# Patient Record
Sex: Male | Born: 1970 | Race: Black or African American | Hispanic: No | Marital: Single | Smoking: Current every day smoker
Health system: Southern US, Community
[De-identification: ages and names within clinical notes are randomized; demographics above are authoritative.]

---

## 2011-11-25 ENCOUNTER — Emergency Department: Payer: Self-pay | Admitting: Emergency Medicine

## 2012-06-06 ENCOUNTER — Emergency Department: Payer: Self-pay | Admitting: Emergency Medicine

## 2017-10-10 ENCOUNTER — Emergency Department: Payer: Self-pay

## 2017-10-10 ENCOUNTER — Other Ambulatory Visit: Payer: Self-pay

## 2017-10-10 ENCOUNTER — Emergency Department
Admission: EM | Admit: 2017-10-10 | Discharge: 2017-10-10 | Disposition: A | Discharge status: Home / Self Care | Payer: Self-pay | Attending: Emergency Medicine | Admitting: Emergency Medicine

## 2017-10-10 ENCOUNTER — Encounter: Payer: Self-pay | Admitting: Emergency Medicine

## 2017-10-10 DIAGNOSIS — F17200 Nicotine dependence, unspecified, uncomplicated: Secondary | ICD-10-CM | POA: Insufficient documentation

## 2017-10-10 DIAGNOSIS — M79672 Pain in left foot: Secondary | ICD-10-CM | POA: Insufficient documentation

## 2017-10-10 MED ORDER — IBUPROFEN 600 MG PO TABS
ORAL_TABLET | ORAL | Status: AC
  Filled 2017-10-10: qty 1

## 2017-10-10 MED ORDER — IBUPROFEN 600 MG PO TABS
600.0000 mg | ORAL_TABLET | Freq: Once | ORAL | Status: AC
  Administered 2017-10-10: 600 mg via ORAL

## 2017-10-10 MED ORDER — IBUPROFEN 600 MG PO TABS: 600.0000 mg | ORAL_TABLET | Freq: Four times a day (QID) | ORAL | 0 refills | Status: DC | PRN
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
## 2017-10-10 NOTE — ED Triage Notes (Signed)
Pt to ED c/o left foot pain and swelling. Pt denies recently injury. Pt states that he is having trouble putting weight on his foot. Pt in NAD at this time.

## 2018-01-20 ENCOUNTER — Emergency Department (HOSPITAL_COMMUNITY): Payer: No Typology Code available for payment source

## 2018-01-20 ENCOUNTER — Emergency Department (HOSPITAL_COMMUNITY)
Admission: EM | Admit: 2018-01-20 | Discharge: 2018-01-20 | Disposition: A | Discharge status: Home / Self Care | Payer: No Typology Code available for payment source | Attending: Emergency Medicine | Admitting: Emergency Medicine

## 2018-01-20 ENCOUNTER — Other Ambulatory Visit: Payer: Self-pay

## 2018-01-20 ENCOUNTER — Encounter (HOSPITAL_COMMUNITY): Payer: Self-pay

## 2018-01-20 DIAGNOSIS — F17200 Nicotine dependence, unspecified, uncomplicated: Secondary | ICD-10-CM | POA: Diagnosis not present

## 2018-01-20 DIAGNOSIS — R0781 Pleurodynia: Secondary | ICD-10-CM

## 2018-01-20 DIAGNOSIS — R0789 Other chest pain: Secondary | ICD-10-CM | POA: Diagnosis not present

## 2018-01-20 DIAGNOSIS — M25512 Pain in left shoulder: Secondary | ICD-10-CM | POA: Diagnosis present

## 2018-01-20 MED ORDER — TRAMADOL HCL 50 MG PO TABS: 50.0000 mg | ORAL_TABLET | Freq: Four times a day (QID) | ORAL | 0 refills | Status: DC | PRN
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
## 2018-01-20 NOTE — ED Notes (Signed)
Pt stable, ambulatory, and verbalizes understanding of d/c instructions.  

## 2018-01-20 NOTE — Discharge Instructions (Signed)
Please read attached information. If you experience any new or worsening signs or symptoms please return to the emergency room for evaluation. Please follow-up with your primary care provider or specialist as discussed. Please use medication prescribed only as directed and discontinue taking if you have any concerning signs or symptoms.   °

## 2018-07-27 ENCOUNTER — Encounter (HOSPITAL_BASED_OUTPATIENT_CLINIC_OR_DEPARTMENT_OTHER): Payer: Self-pay | Admitting: Emergency Medicine

## 2018-07-27 ENCOUNTER — Emergency Department (HOSPITAL_BASED_OUTPATIENT_CLINIC_OR_DEPARTMENT_OTHER)
Admission: EM | Admit: 2018-07-27 | Discharge: 2018-07-27 | Disposition: A | Discharge status: Home / Self Care | Payer: Self-pay | Attending: Emergency Medicine | Admitting: Emergency Medicine

## 2018-07-27 ENCOUNTER — Other Ambulatory Visit: Payer: Self-pay

## 2018-07-27 ENCOUNTER — Emergency Department (HOSPITAL_BASED_OUTPATIENT_CLINIC_OR_DEPARTMENT_OTHER): Payer: Self-pay

## 2018-07-27 DIAGNOSIS — R197 Diarrhea, unspecified: Secondary | ICD-10-CM | POA: Insufficient documentation

## 2018-07-27 DIAGNOSIS — F17200 Nicotine dependence, unspecified, uncomplicated: Secondary | ICD-10-CM | POA: Insufficient documentation

## 2018-07-27 DIAGNOSIS — R112 Nausea with vomiting, unspecified: Secondary | ICD-10-CM | POA: Insufficient documentation

## 2018-07-27 DIAGNOSIS — A599 Trichomoniasis, unspecified: Secondary | ICD-10-CM | POA: Insufficient documentation

## 2018-07-27 LAB — COMPREHENSIVE METABOLIC PANEL
ALBUMIN: 3.9 g/dL (ref 3.5–5.0)
ALK PHOS: 63 U/L (ref 38–126)
ALT: 12 U/L (ref 0–44)
ANION GAP: 9 (ref 5–15)
AST: 24 U/L (ref 15–41)
BUN: 13 mg/dL (ref 6–20)
CO2: 25 mmol/L (ref 22–32)
Calcium: 8.9 mg/dL (ref 8.9–10.3)
Chloride: 102 mmol/L (ref 98–111)
Creatinine, Ser: 1.16 mg/dL (ref 0.61–1.24)
GFR calc Af Amer: >60 mL/min (ref >60)
GFR calc non Af Amer: >60 mL/min (ref >60)
GLUCOSE: 111 mg/dL — AB (ref 70–99)
POTASSIUM: 4 mmol/L (ref 3.5–5.1)
SODIUM: 136 mmol/L (ref 135–145)
Total Bilirubin: 0.8 mg/dL (ref 0.3–1.2)
Total Protein: 7.8 g/dL (ref 6.5–8.1)

## 2018-07-27 LAB — LIPASE, BLOOD: Lipase: 28 U/L (ref 11–51)

## 2018-07-27 LAB — URINALYSIS, MICROSCOPIC (REFLEX)

## 2018-07-27 LAB — CBC WITH DIFFERENTIAL/PLATELET
BASOS PCT: 0 %
Basophils Absolute: 0 10*3/uL (ref 0.0–0.1)
Eosinophils Absolute: 0.1 10*3/uL (ref 0.0–0.7)
Eosinophils Relative: 1 %
HCT: 36.7 % — ABNORMAL LOW (ref 39.0–52.0)
HEMOGLOBIN: 12.5 g/dL — AB (ref 13.0–17.0)
Lymphocytes Relative: 29 %
Lymphs Abs: 2.2 10*3/uL (ref 0.7–4.0)
MCH: 30.4 pg (ref 26.0–34.0)
MCHC: 34.1 g/dL (ref 30.0–36.0)
MCV: 89.3 fL (ref 78.0–100.0)
MONOS PCT: 10 %
Monocytes Absolute: 0.8 10*3/uL (ref 0.1–1.0)
NEUTROS ABS: 4.6 10*3/uL (ref 1.7–7.7)
NEUTROS PCT: 60 %
Platelets: 170 10*3/uL (ref 150–400)
RBC: 4.11 MIL/uL — ABNORMAL LOW (ref 4.22–5.81)
RDW: 12 % (ref 11.5–15.5)
WBC: 7.5 10*3/uL (ref 4.0–10.5)

## 2018-07-27 LAB — URINALYSIS, ROUTINE W REFLEX MICROSCOPIC
Bilirubin Urine: NEGATIVE
Glucose, UA: NEGATIVE mg/dL
Hgb urine dipstick: NEGATIVE
Ketones, ur: NEGATIVE mg/dL
NITRITE: NEGATIVE
PH: 6 (ref 5.0–8.0)
Protein, ur: NEGATIVE mg/dL

## 2018-07-27 MED ORDER — AZITHROMYCIN 250 MG PO TABS
1000.0000 mg | ORAL_TABLET | Freq: Once | ORAL | Status: AC
  Administered 2018-07-27: 1000 mg via ORAL
  Filled 2018-07-27: qty 4

## 2018-07-27 MED ORDER — METRONIDAZOLE 500 MG PO TABS
2000.0000 mg | ORAL_TABLET | Freq: Once | ORAL | Status: AC
  Administered 2018-07-27: 2000 mg via ORAL
  Filled 2018-07-27: qty 4

## 2018-07-27 MED ORDER — ONDANSETRON HCL 4 MG/2ML IJ SOLN
4.0000 mg | Freq: Once | INTRAMUSCULAR | Status: AC
Start: 2018-07-27 — End: 2018-07-27
  Administered 2018-07-27: 4 mg via INTRAVENOUS
  Filled 2018-07-27: qty 2

## 2018-07-27 MED ORDER — LOPERAMIDE HCL 2 MG PO CAPS
2.0000 mg | ORAL_CAPSULE | Freq: Once | ORAL | Status: AC
  Administered 2018-07-27: 2 mg via ORAL
  Filled 2018-07-27: qty 1

## 2018-07-27 MED ORDER — CEFTRIAXONE SODIUM 250 MG IJ SOLR
250.0000 mg | Freq: Once | INTRAMUSCULAR | Status: AC
  Administered 2018-07-27: 250 mg via INTRAMUSCULAR
  Filled 2018-07-27: qty 250

## 2018-07-27 MED ORDER — KETOROLAC TROMETHAMINE 30 MG/ML IJ SOLN
30.0000 mg | Freq: Once | INTRAMUSCULAR | Status: AC
  Administered 2018-07-27: 30 mg via INTRAVENOUS
  Filled 2018-07-27: qty 1

## 2018-07-27 MED ORDER — PROMETHAZINE HCL 25 MG PO TABS: 25.0000 mg | ORAL_TABLET | Freq: Four times a day (QID) | ORAL | 0 refills | Status: AC | PRN

## 2018-07-27 MED ORDER — SODIUM CHLORIDE 0.9 % IV BOLUS (SEPSIS)
1000.0000 mL | Freq: Once | INTRAVENOUS | Status: AC
  Administered 2018-07-27: 1000 mL via INTRAVENOUS

## 2018-07-27 NOTE — ED Provider Notes (Signed)
TIME SEEN: 3:16 AM  CHIEF COMPLAINT: Flulike symptoms  HPI: Patient is a 47 year old male with no significant past medical history who presents to the emergency department with flulike symptoms.  Reports diffuse headache, body aches, productive cough, vomiting, diarrhea.  No abdominal pain.  No sick contacts or recent travel.  No dysuria, hematuria, discharge.  No rash or tick bites.  No known fever.  ROS: See HPI Constitutional: no fever  Eyes: no drainage  ENT: no runny nose   Cardiovascular:  no chest pain  Resp: no SOB  GI: Diarrhea and vomiting GU: no dysuria Integumentary: no rash  Allergy: no hives  Musculoskeletal: no leg swelling  Neurological: no slurred speech ROS otherwise negative  PAST MEDICAL HISTORY/PAST SURGICAL HISTORY:  History reviewed. No pertinent past medical history.  MEDICATIONS:  Prior to Admission medications   Not on File    ALLERGIES:  No Known Allergies  SOCIAL HISTORY:  Social History   Tobacco Use  . Smoking status: Current Every Day Smoker  . Smokeless tobacco: Never Used  Substance Use Topics  . Alcohol use: No    Frequency: Never    FAMILY HISTORY: No family history on file.  EXAM: BP 131/85   Pulse 91   Temp 98.9 F (37.2 C) (Oral)   Resp 18   Ht 5\' 8"  (1.727 m)   Wt 81.6 kg   SpO2 100%   BMI 27.37 kg/m  CONSTITUTIONAL: Alert and oriented and responds appropriately to questions. Well-appearing; well-nourished HEAD: Normocephalic EYES: Conjunctivae clear, pupils appear equal, EOMI ENT: normal nose; moist mucous membranes; No pharyngeal erythema or petechiae, no tonsillar hypertrophy or exudate, no uvular deviation, no unilateral swelling, no trismus or drooling, no muffled voice, normal phonation, no stridor, no dental caries present, no drainable dental abscess noted, no Ludwig's angina, tongue sits flat in the bottom of the mouth, no angioedema, no facial erythema or warmth, no facial swelling; no pain with movement of  the neck. NECK: Supple, no meningismus, no nuchal rigidity, no LAD  CARD: RRR; S1 and S2 appreciated; no murmurs, no clicks, no rubs, no gallops RESP: Normal chest excursion without splinting or tachypnea; breath sounds bilaterally without wheezing or rhonchi, some crackles noted at the left lung base, no hypoxia or respiratory distress, speaking full sentences ABD/GI: Normal bowel sounds; non-distended; soft, non-tender, no rebound, no guarding, no peritoneal signs, no hepatosplenomegaly BACK:  The back appears normal and is non-tender to palpation, there is no CVA tenderness EXT: Normal ROM in all joints; non-tender to palpation; no edema; normal capillary refill; no cyanosis, no calf tenderness or swelling    SKIN: Normal color for age and race; warm; no rash NEURO: Moves all extremities equally PSYCH: The patient's mood and manner are appropriate. Grooming and personal hygiene are appropriate.  MEDICAL DECISION MAKING: Patient here with flulike symptoms.  He does have some asymmetric breath sounds.  Will obtain chest x-ray to evaluate for possible pneumonia.  Reports 3 to 4 days of vomiting and diarrhea.  Will check labs to ensure no electrolyte derangement.  Will check kidney function, LFTs and lipase.  Abdominal exam today is benign.  Doubt appendicitis, colitis, cholecystitis, bowel obstruction.  Suspect viral illness causing symptoms.  We will treat symptomatically with IV fluids, Toradol, Zofran, Imodium.  ED PROGRESS: Patient's labs are unremarkable.  Chest x-ray shows no pneumonia.  He reports feeling better.  Drinking without difficulty or vomiting.    Patient's urine shows that he is positive for trichomonas.  We  will treat with 2 g of Flagyl.  We will also empirically cover for gonorrhea and chlamydia today.  He declines testing for gonorrhea and chlamydia.  Will give outpatient follow-up for the STD clinic for HIV and syphilis testing.  Have recommended he avoid sexual intercourse for  the next week.  We will also provide his partner with 2 g of Flagyl as she is at the bedside.   At this time, I do not feel there is any life-threatening condition present. I have reviewed and discussed all results (EKG, imaging, lab, urine as appropriate) and exam findings with patient/family. I have reviewed nursing notes and appropriate previous records.  I feel the patient is safe to be discharged home without further emergent workup and can continue workup as an outpatient as needed. Discussed usual and customary return precautions. Patient/family verbalize understanding and are comfortable with this plan.  Outpatient follow-up has been provided if needed. All questions have been answered.     Arieana Somoza, Layla Maw, DO 07/27/18 [phone]

## 2018-07-27 NOTE — ED Triage Notes (Addendum)
Pt reports vomiting intermittently x 3-4 days. Pt also c/o cough, headache and diarrhea.

## 2018-07-27 NOTE — Discharge Instructions (Addendum)
You may alternate Tylenol 1000 mg every 6 hours as needed for pain and Ibuprofen 800 mg every 8 hours as needed for pain.  Please take Ibuprofen with food.  You may use over-the-counter Imodium as needed for diarrhea.   You have been treated today for gonorrhea, chlamydia and trichomonas.  I recommend close follow-up with the STD clinic for HIV and syphilis testing.  Please avoid sexual intercourse for the next week.   To find a primary care or specialty doctor please call [phone] or [phone] to access "[facility] Find a Doctor Service."  You may also go on the [facility] website at [email or web address]  There are also multiple [facility], [facility], [facility] and [facility] practices throughout the [place] that are frequently accepting new patients. You may find a clinic that is close to your home and contact them.  [facility] -  [address] [place] [phone]   [facility] -  [address] [place] [phone]   [facility] [phone]  [place] [phone]

## 2018-07-27 NOTE — ED Notes (Signed)
ED Provider at bedside. 

## 2019-05-10 IMAGING — DX DG CHEST 2V
2 series · 2 of 2 positions shown · non-contrast
Comparison: 01/20/2018

CLINICAL DATA: Cough, cold and congestion for 4 days. Fever. Body
aches.

EXAM:
CHEST - 2 VIEW

[chest pa]
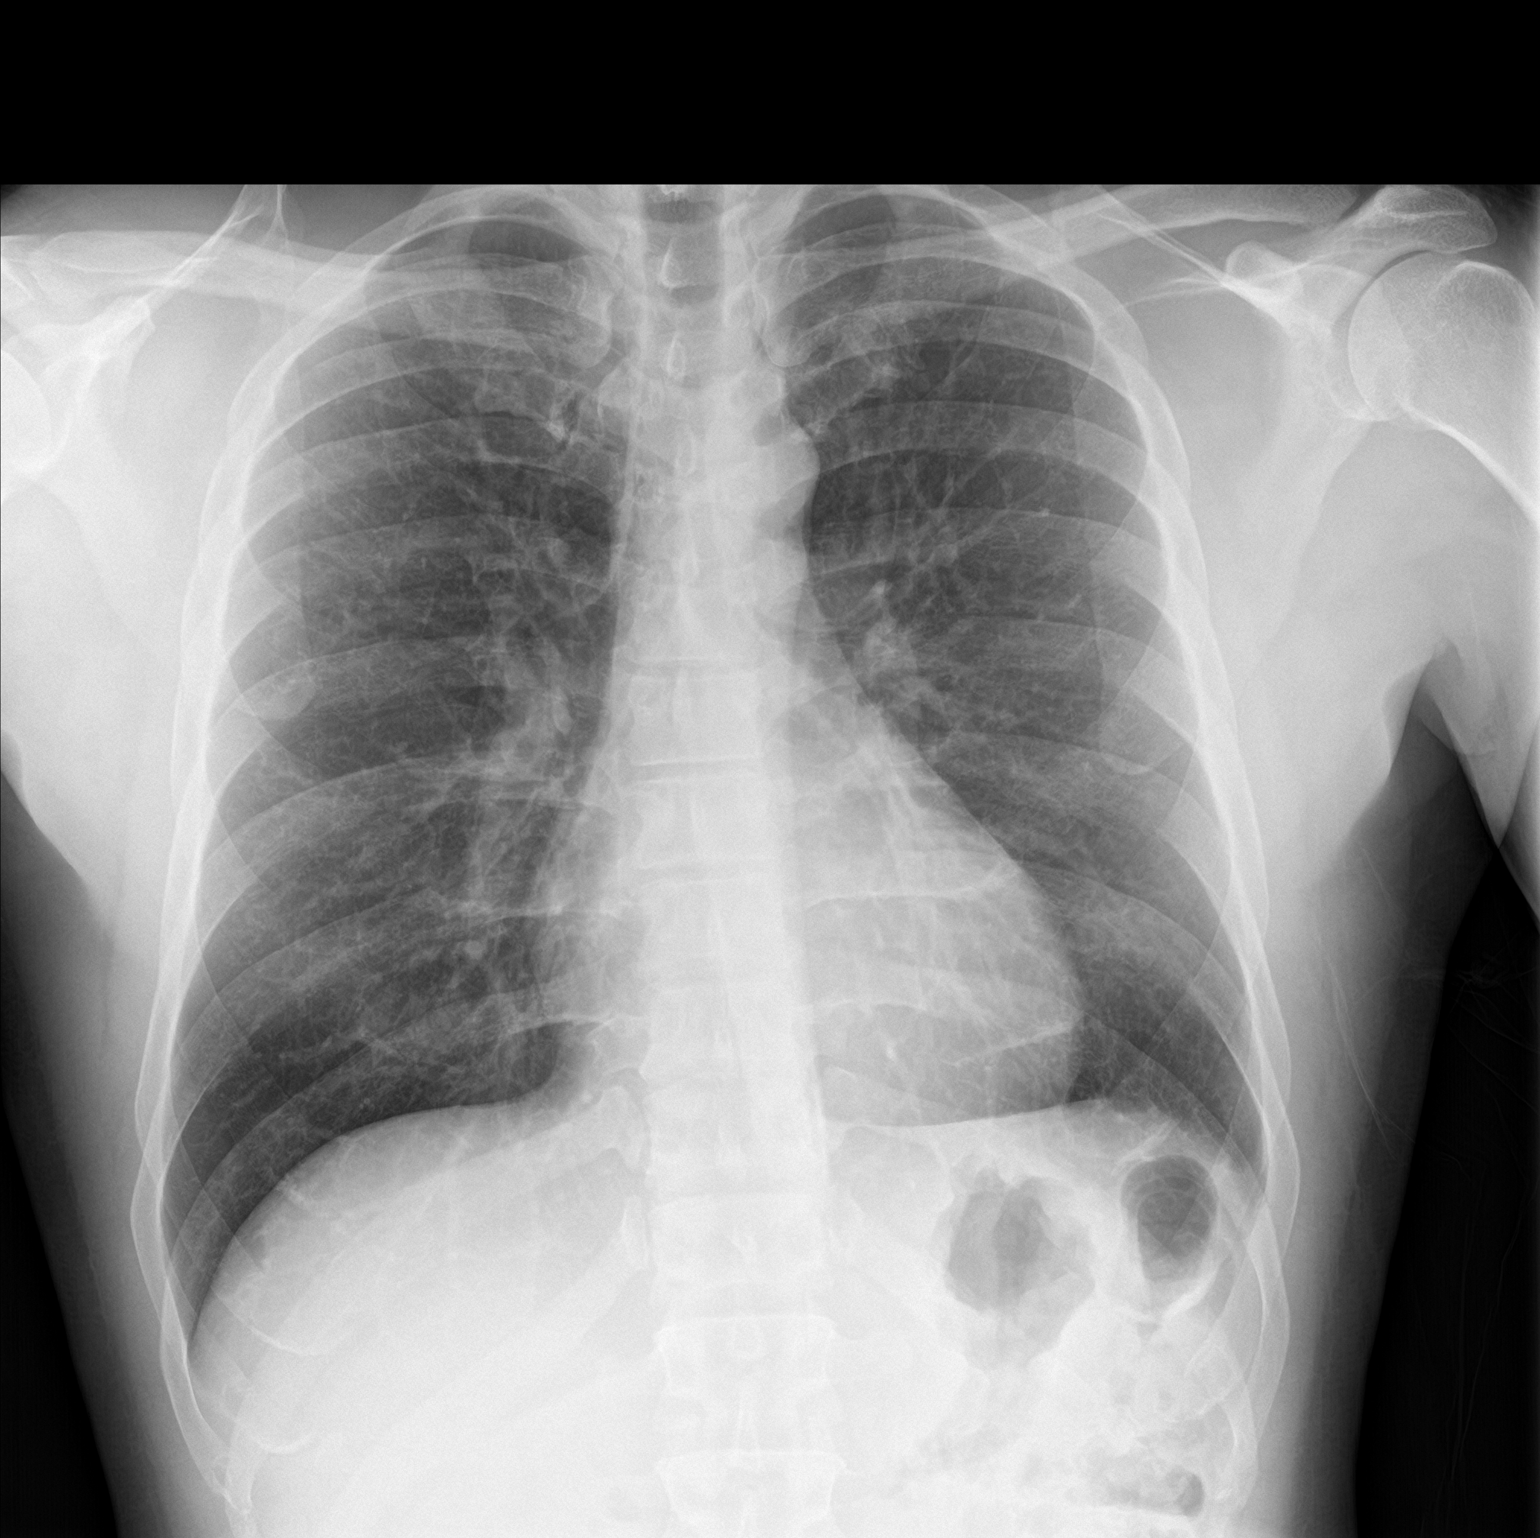

[chest lat]
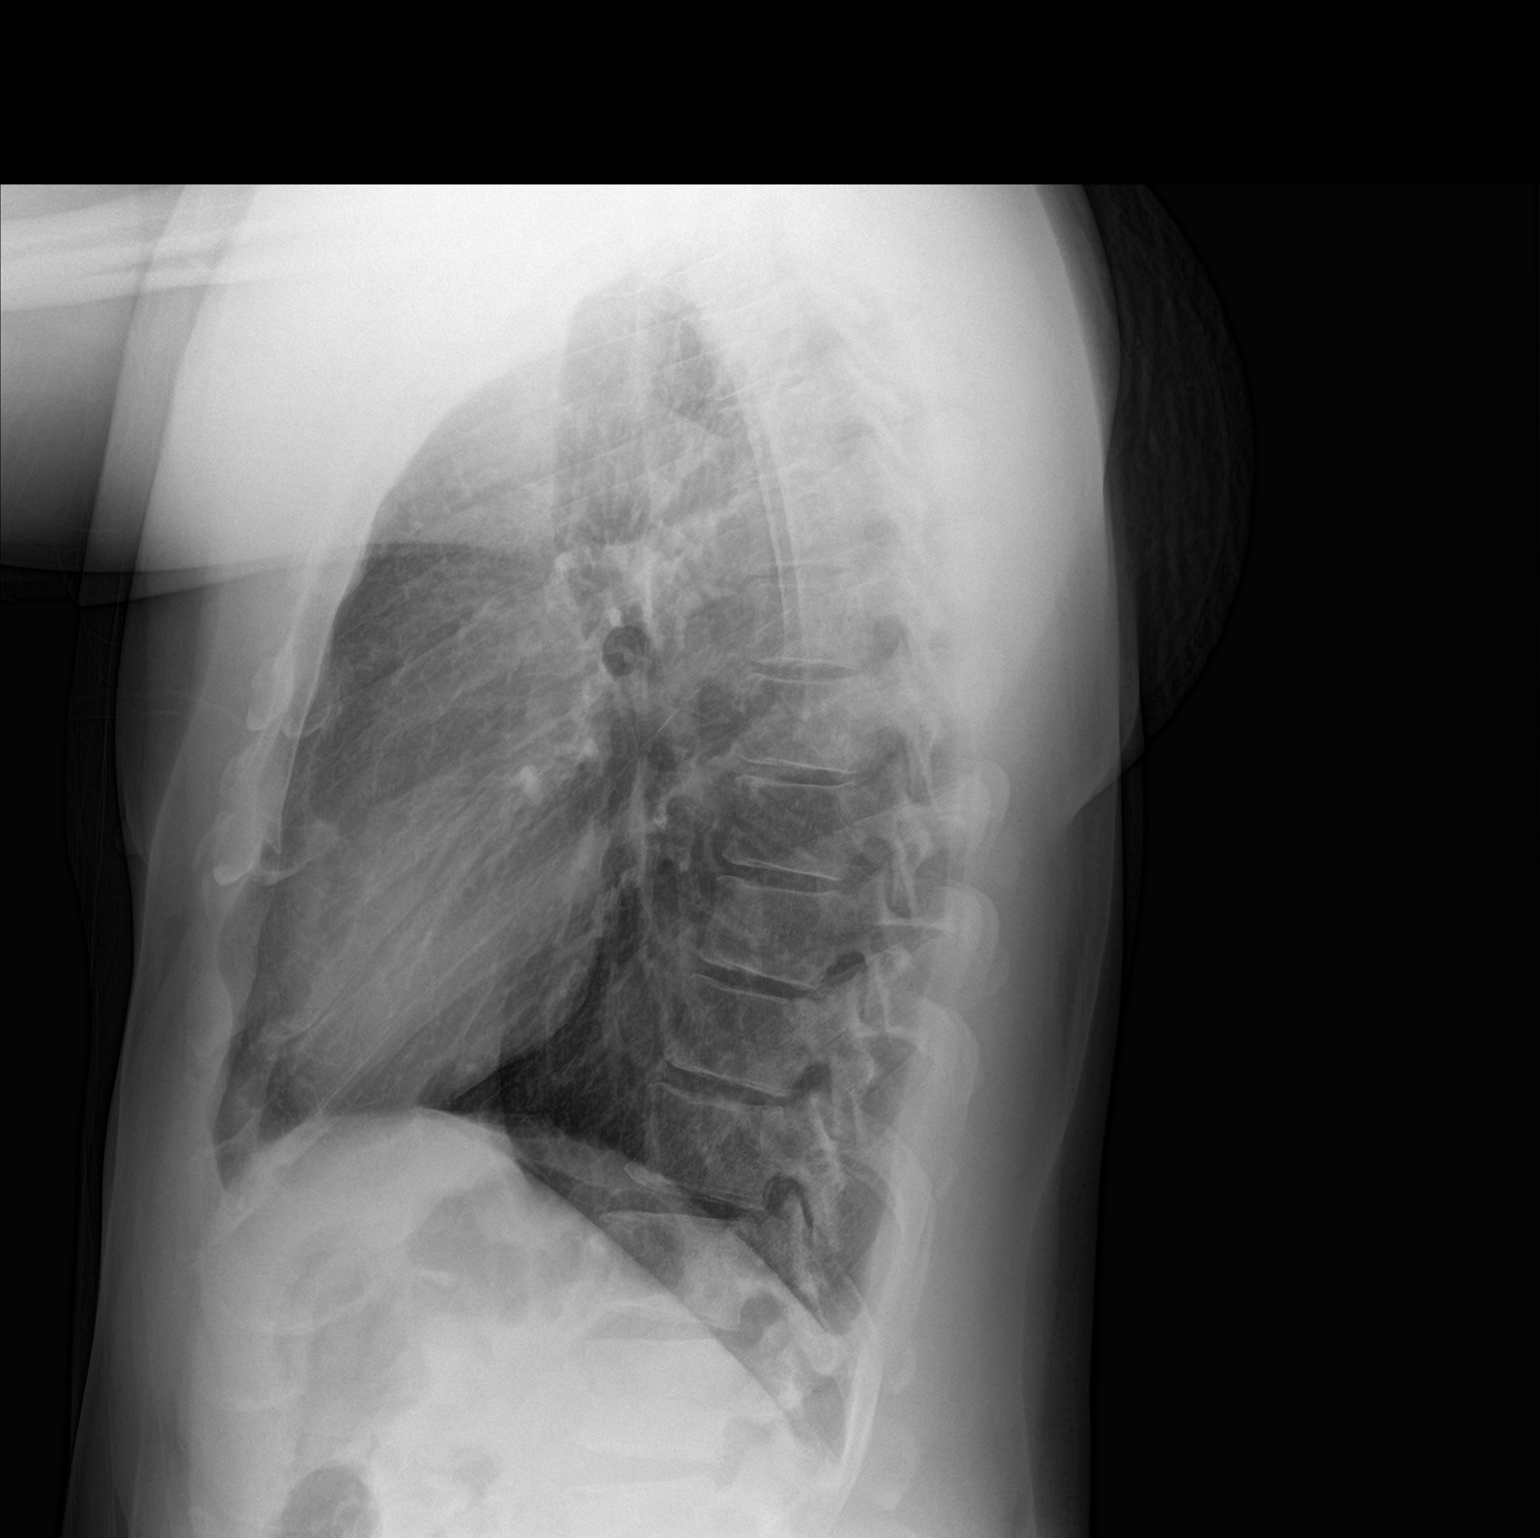

[2 of 2 positions shown; findings below may reference images not displayed]

FINDINGS: The heart size and mediastinal contours are within normal limits.
Both lungs are clear. The visualized skeletal structures are
unremarkable.
IMPRESSION: No active cardiopulmonary disease.
